# Patient Record
Sex: Female | Born: 1955 | Race: White | Hispanic: No | Marital: Married | Smoking: Never smoker
Health system: Southern US, Academic
[De-identification: ages and names within clinical notes are randomized; demographics above are authoritative.]

## PROBLEM LIST (undated history)

## (undated) DIAGNOSIS — H6690 Otitis media, unspecified, unspecified ear: Secondary | ICD-10-CM

## (undated) HISTORY — PX: HX BREAST BIOPSY: SHX20

## (undated) HISTORY — DX: Otitis media, unspecified, unspecified ear: H66.90

## (undated) HISTORY — PX: HX CYST REMOVAL: SHX22

---

## 2001-08-18 ENCOUNTER — Inpatient Hospital Stay (HOSPITAL_COMMUNITY): Admit: 2001-08-18 | Discharge: 2001-08-18 | Disposition: A | Discharge status: Home / Self Care | Payer: Self-pay

## 2010-05-11 ENCOUNTER — Inpatient Hospital Stay (HOSPITAL_COMMUNITY): Admit: 2010-05-11 | Discharge: 2010-05-11 | Disposition: A | Discharge status: Home / Self Care | Payer: Self-pay

## 2012-10-03 HISTORY — PX: HX BREAST BIOPSY: SHX20

## 2012-11-20 ENCOUNTER — Inpatient Hospital Stay (HOSPITAL_COMMUNITY): Admit: 2012-11-20 | Discharge: 2012-11-20 | Disposition: A | Discharge status: Home / Self Care | Payer: Self-pay

## 2016-03-24 ENCOUNTER — Ambulatory Visit (HOSPITAL_COMMUNITY): Payer: Self-pay | Admitting: Sports Medicine

## 2016-03-24 ENCOUNTER — Encounter (INDEPENDENT_AMBULATORY_CARE_PROVIDER_SITE_OTHER): Payer: Self-pay | Admitting: Sports Medicine

## 2016-03-24 ENCOUNTER — Ambulatory Visit (INDEPENDENT_AMBULATORY_CARE_PROVIDER_SITE_OTHER): Payer: No Typology Code available for payment source | Admitting: Sports Medicine

## 2016-03-24 VITALS — Ht 63.0 in | Wt 180.0 lb

## 2016-03-24 DIAGNOSIS — S92009A Unspecified fracture of unspecified calcaneus, initial encounter for closed fracture: Secondary | ICD-10-CM

## 2016-03-24 DIAGNOSIS — S93402A Sprain of unspecified ligament of left ankle, initial encounter: Secondary | ICD-10-CM

## 2016-03-24 DIAGNOSIS — S93409A Sprain of unspecified ligament of unspecified ankle, initial encounter: Secondary | ICD-10-CM

## 2016-03-24 DIAGNOSIS — S92002A Unspecified fracture of left calcaneus, initial encounter for closed fracture: Secondary | ICD-10-CM

## 2016-03-26 NOTE — Progress Notes (Signed)
[facility]  [address]  Monica Vargas Monica Vargas [id number]      NAME: Monica Vargas  DATE: 03/26/2016  DOB: 1956/05/17  MRN: [id number]      CHIEF COMPLAINT   Foot Injury (left calcaneal fx )    HPI:  Monica DandyMary is here for her left foot and heel.  She was at church and tripped over a speaker.  She had a twisting injury to this left foot. She has 4/10 pain.  It is lateral worse with putting weight on it.  It is better with Advil.  She has been using crutches. This injury happened about 3 or 4 days ago.  No paresthesias.  No skin issues.  No previous issues with this ankle.  No low back or radicular type signs. No calf pain or swelling.        PAST MEDICAL HISTORY:  There is no problem list on file for this patient.    PAST SURGICAL HISTORY:  Past Surgical History:   Procedure Laterality Date    HX CYST REMOVAL      breast          CURRENT MEDICATIONS:   No current outpatient prescriptions on file.     No current facility-administered medications for this visit.      ALLERGIES:  Review of patient's allergies indicates no known allergies.  SOCIAL HISTORY:   Social History     Social History    Marital status: Married     Spouse name: N/A    Number of children: N/A    Years of education: N/A     Social History Main Topics    Smoking status: Never Smoker    Smokeless tobacco: Never Used    Alcohol use Not on file    Drug use: Not on file    Sexual activity: Not on file     Other Topics Concern    Not on file     Social History Narrative    No narrative on file     FAMILY HISTORY:  No family history on file.        ROS  Left foot and heel injury - all others were reviewed and negative    PHYSICAL EXAM  Ht 1.6 m (5\' 3" )   Wt 81.6 kg (180 lb)   BMI 31.89 kg/m2        GENERAL EXAM    Constitutional: She is oriented to person, place, and time. She appears well-developed and well-nourished.   HENT:   Head: Normocephalic and atraumatic.   Eyes: Conjunctivae are normal. Pupils are equal, round.    Neck: Normal range of motion.   Cardiovascular: Normal rate.    Pulmonary/Chest: Effort normal.   Abdominal: Non-protuberant   Neurological: She is alert and oriented to person, place, and time.   Skin: Skin is warm and dry.   Psychiatric: She has a normal mood and affect. Her behavior is normal. Judgment and thought content normal.     Nursing note and vitals reviewed.    ORTHO EXAM   Physical examination left foot heel shows some mild swelling in the lateral aspect near the lateral ligamentous complex of the ankle.  He she has some mild pain with palpation in that area.  Calcaneal squeeze test is negative. Her Achilles is intact.  Her arch is normal her calf is soft and supple.  Her skin is normal. Her dorsiflexion plantar flexion inversion and inversion are within normal  limits.  Strength is mildly limited secondary to pain.  Normal motor sensory and vascular exam distally. I do not appreciate any ankle instability at 0 and plantar flexion with drawer talar tilt testing.    IMAGING     X-rays reviewed which shows no large fracture.  There is one view which shows a very small avulsion off of the what looks like the lateral wall of the calcaneus. This may be an avulsion of the calcaneal fibular ligament.        ASSESSMENT      (S93.409A) Sprain of ankle  (primary encounter diagnosis)  Plan: PHYSICAL THERAPY (ANKLE)            (S92.009A) Calcaneal fracture  Plan:           PLAN   At this point will treat it like a severe ankle sprain.  With aggressive rehab work herself off of the crutches. Ice and elevation.  She has used over-the-counter anti-inflammatories.  I will see her back in 6 weeks.       Duard LarsenJoseph Aunya Lemler, MD

## 2016-05-03 DIAGNOSIS — S92002A Unspecified fracture of left calcaneus, initial encounter for closed fracture: Secondary | ICD-10-CM | POA: Insufficient documentation

## 2016-05-10 ENCOUNTER — Ambulatory Visit
Admission: RE | Admit: 2016-05-10 | Discharge: 2016-05-10 | Disposition: A | Discharge status: Home / Self Care | Payer: BC Managed Care – PPO | Source: Ambulatory Visit | Attending: Sports Medicine | Admitting: Sports Medicine

## 2016-05-10 ENCOUNTER — Ambulatory Visit (INDEPENDENT_AMBULATORY_CARE_PROVIDER_SITE_OTHER): Payer: BC Managed Care – PPO | Admitting: Sports Medicine

## 2016-05-10 ENCOUNTER — Encounter (HOSPITAL_BASED_OUTPATIENT_CLINIC_OR_DEPARTMENT_OTHER): Payer: Self-pay | Admitting: Sports Medicine

## 2016-05-10 VITALS — Ht 63.0 in | Wt 180.0 lb

## 2016-05-10 DIAGNOSIS — S92002D Unspecified fracture of left calcaneus, subsequent encounter for fracture with routine healing: Secondary | ICD-10-CM

## 2016-05-10 DIAGNOSIS — S93402D Sprain of unspecified ligament of left ankle, subsequent encounter: Secondary | ICD-10-CM

## 2016-05-10 DIAGNOSIS — M7732 Calcaneal spur, left foot: Secondary | ICD-10-CM | POA: Insufficient documentation

## 2016-05-10 DIAGNOSIS — S92002A Unspecified fracture of left calcaneus, initial encounter for closed fracture: Principal | ICD-10-CM | POA: Insufficient documentation

## 2016-05-10 DIAGNOSIS — Z6831 Body mass index (BMI) 31.0-31.9, adult: Secondary | ICD-10-CM

## 2016-05-10 DIAGNOSIS — S93409A Sprain of unspecified ligament of unspecified ankle, initial encounter: Secondary | ICD-10-CM

## 2016-05-10 NOTE — Progress Notes (Signed)
[facility]  [address]  [place] [address]      NAME: IRINE HEMINGER  DATE: 05/10/2016  DOB: [birth year]  MRN: [id number]      CHIEF COMPLAINT   Foot Injury (left calcanel fx)    HPI:  Monica Vargas is here for follow-up of her left ankle sprain.  She is doing fantastic.  She is back to normal activities.  She has minimal to no pain. No new injury.  No neurovascular compromise.    PAST MEDICAL HISTORY:  Patient Active Problem List   Diagnosis    Left calcaneal fracture     PAST SURGICAL HISTORY:  Past Surgical History:   Procedure Laterality Date    HX CYST REMOVAL      breast          CURRENT MEDICATIONS:   No current outpatient prescriptions on file.     No current facility-administered medications for this visit.      ALLERGIES:  Review of patient's allergies indicates no known allergies.  SOCIAL HISTORY:   Social History     Social History    Marital status: Married     Spouse name: N/A    Number of children: N/A    Years of education: N/A     Social History Main Topics    Smoking status: Never Smoker    Smokeless tobacco: Never Used    Alcohol use Not on file    Drug use: Not on file    Sexual activity: Not on file     Other Topics Concern    Not on file     Social History Narrative     FAMILY HISTORY:  No family history on file.        ROS  Improved ankle pain left - all others were reviewed and negative    PHYSICAL EXAM  Ht 1.6 m ( )   Wt 81.6 kg (180 lb)   BMI 31.89 kg/m2        GENERAL EXAM    Constitutional: She is oriented to person, place, and time. She appears well-developed and well-nourished.   HENT:   Head: Normocephalic and atraumatic.   Eyes: Conjunctivae are normal. Pupils are equal, round.   Neck: Normal range of motion.   Cardiovascular: Normal rate.    Pulmonary/Chest: Effort normal.   Abdominal: Non-protuberant   Neurological: She is alert and oriented to person, place, and time.   Skin: Skin is warm and dry.   Psychiatric: She has a normal mood and  affect. Her behavior is normal. Judgment and thought content normal.     Nursing note and vitals reviewed.    ORTHO EXAM   No deformity.  No swelling or ecchymosis.  No erythema, fluctuance or drainage.   Skin is normal.  Dorsiflexion, plantarflexion, eversion, inversion are normal.  Strength is 5/5 in those directions.  Sensation is intact in all nerve distributions.  Pulses are palpable with brisk cap refill.  No ankle instability with drawer and talar tilt testing in neutral and plantarflexion.  Achilles tendon is intact. No pain with palpation.  Normal gait.    IMAGING             ASSESSMENT      (S92.002A) Left calcaneal fracture  (primary encounter diagnosis)  Plan: XR HEEL LEFT (CALCANEUS)            (S93.409A) Sprain of ankle  Plan:  PLAN   Activities as tolerated follow-up as needed       Duard LarsenJoseph Elwyn Lowden, MD

## 2019-04-22 ENCOUNTER — Other Ambulatory Visit (HOSPITAL_COMMUNITY): Payer: Self-pay | Admitting: FAMILY PRACTICE

## 2019-04-22 DIAGNOSIS — Z1231 Encounter for screening mammogram for malignant neoplasm of breast: Secondary | ICD-10-CM

## 2019-04-28 ENCOUNTER — Ambulatory Visit
Admission: RE | Admit: 2019-04-28 | Discharge: 2019-04-28 | Disposition: A | Discharge status: Home / Self Care | Payer: 59 | Source: Ambulatory Visit | Attending: FAMILY PRACTICE | Admitting: FAMILY PRACTICE

## 2019-04-28 ENCOUNTER — Other Ambulatory Visit: Payer: Self-pay

## 2019-04-28 ENCOUNTER — Encounter (HOSPITAL_COMMUNITY): Payer: Self-pay | Admitting: Mammography

## 2019-04-28 DIAGNOSIS — N6489 Other specified disorders of breast: Secondary | ICD-10-CM | POA: Insufficient documentation

## 2019-04-28 DIAGNOSIS — Z1231 Encounter for screening mammogram for malignant neoplasm of breast: Secondary | ICD-10-CM

## 2019-04-29 ENCOUNTER — Other Ambulatory Visit (HOSPITAL_COMMUNITY): Payer: Self-pay

## 2019-05-06 ENCOUNTER — Other Ambulatory Visit (HOSPITAL_COMMUNITY): Payer: Self-pay

## 2020-03-24 ENCOUNTER — Other Ambulatory Visit (HOSPITAL_COMMUNITY): Payer: Self-pay

## 2020-03-24 LAB — EXTERNAL COVID-19 MOLECULAR RESULT: External 2019-n-CoV/SARS-CoV-2: POSITIVE — AB

## 2020-03-25 ENCOUNTER — Emergency Department
Admission: EM | Admit: 2020-03-25 | Discharge: 2020-03-25 | Disposition: A | Discharge status: Other Acute Inpatient Hospital | Payer: 59

## 2020-03-25 DIAGNOSIS — Z5321 Procedure and treatment not carried out due to patient leaving prior to being seen by health care provider: Secondary | ICD-10-CM | POA: Insufficient documentation

## 2021-06-25 ENCOUNTER — Other Ambulatory Visit (HOSPITAL_COMMUNITY): Payer: Self-pay | Admitting: FAMILY PRACTICE

## 2021-06-25 DIAGNOSIS — Z1231 Encounter for screening mammogram for malignant neoplasm of breast: Secondary | ICD-10-CM

## 2021-07-08 ENCOUNTER — Ambulatory Visit (INDEPENDENT_AMBULATORY_CARE_PROVIDER_SITE_OTHER): Payer: 59 | Admitting: Mammography

## 2021-07-08 ENCOUNTER — Encounter (INDEPENDENT_AMBULATORY_CARE_PROVIDER_SITE_OTHER): Payer: Self-pay | Admitting: Mammography

## 2021-07-08 ENCOUNTER — Other Ambulatory Visit: Payer: Self-pay

## 2021-07-08 DIAGNOSIS — Z1231 Encounter for screening mammogram for malignant neoplasm of breast: Secondary | ICD-10-CM

## 2021-10-27 ENCOUNTER — Ambulatory Visit (HOSPITAL_BASED_OUTPATIENT_CLINIC_OR_DEPARTMENT_OTHER): Payer: Medicare Other | Admitting: Audiologist

## 2021-10-27 ENCOUNTER — Other Ambulatory Visit: Payer: Self-pay

## 2021-10-27 ENCOUNTER — Ambulatory Visit: Payer: Medicare Other | Attending: Otolaryngology | Admitting: Otolaryngology

## 2021-10-27 ENCOUNTER — Encounter (HOSPITAL_BASED_OUTPATIENT_CLINIC_OR_DEPARTMENT_OTHER): Payer: Self-pay | Admitting: Otolaryngology

## 2021-10-27 VITALS — Temp 98.2°F | Resp 16 | Ht 62.0 in | Wt 199.3 lb

## 2021-10-27 DIAGNOSIS — J343 Hypertrophy of nasal turbinates: Secondary | ICD-10-CM | POA: Insufficient documentation

## 2021-10-27 DIAGNOSIS — J309 Allergic rhinitis, unspecified: Secondary | ICD-10-CM | POA: Insufficient documentation

## 2021-10-27 DIAGNOSIS — H9313 Tinnitus, bilateral: Secondary | ICD-10-CM | POA: Insufficient documentation

## 2021-10-27 DIAGNOSIS — H905 Unspecified sensorineural hearing loss: Secondary | ICD-10-CM | POA: Insufficient documentation

## 2021-10-27 DIAGNOSIS — R0981 Nasal congestion: Secondary | ICD-10-CM | POA: Insufficient documentation

## 2021-10-27 DIAGNOSIS — H6983 Other specified disorders of Eustachian tube, bilateral: Secondary | ICD-10-CM | POA: Insufficient documentation

## 2021-10-27 DIAGNOSIS — H903 Sensorineural hearing loss, bilateral: Secondary | ICD-10-CM | POA: Insufficient documentation

## 2021-10-27 DIAGNOSIS — H73893 Other specified disorders of tympanic membrane, bilateral: Secondary | ICD-10-CM | POA: Insufficient documentation

## 2021-10-27 MED ORDER — AZELASTINE 137 MCG (0.1 %) NASAL SPRAY AEROSOL
1.00 | INHALATION_SPRAY | Freq: Two times a day (BID) | NASAL | 5 refills | Status: DC
Start: 2021-10-27 — End: 2021-11-23

## 2021-10-27 NOTE — H&P (Signed)
ENT, Physician Office Building  [address]  [place] [id number]  [phone]      Date: 10/27/2021  Name: Monica RoofMary K Vargas  Age: 66 y.o.  DOB:  1956/06/06    Chief Complaint: Otitis Media (Recurrent )      History of Present Illness:     Monica Vargas is a 66 y.o. female brought in today for evaluation of chronic ear issues as well as sinus problems.  This has been an ongoing problem over the past 5 years but seems to have got worse over the past year since COVID.    The patient will usually go to a facility for evaluation secondary to Congestion, Runny nose, Swollen glands and Lethargy. She has been treated for 2 ear infection(s) in the last 6 months. She has been treated for 2 ear infection(s) in the last 12 months. She has been treated with Z-pak. She confirms that there has been persistent fluid on the ear(s). She has a lot of allergy symptoms such as nasal congestion drainage. The patient has been treated long term with Flonase and Claritin. She feels that the treatment has not made a difference.     Corrie DandyMary has not had surgery performed on her ear(s). She states that there has not been trauma to her ear(s). There is a family history of hearing loss. She is concerned for a hearing deficit at this time. She does complain of tinnitus. She does not have a significant history of noise exposure. 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     In addition she discusses the fact that she has tinnitus which has been progressive over the past several years as well.  Affects both ears.    Past Medical History:     Past Medical History:   Diagnosis Date    Otitis media       Past Surgical History:   Procedure Laterality Date    Hx breast biopsy Right 2014    Hx breast biopsy Right     Hx breast biopsy Left     Hx cyst removal        Current Outpatient Medications   Medication Sig    azelastine (ASTELIN) 137 mcg (0.1 %) Nasal Aerosol, Spray Administer 1 Spray into each nostril Twice daily Use in  each nostril as directed    fluticasone propionate (FLONASE) 50 mcg/actuation Nasal Spray, Suspension Administer 1 Spray into each nostril Once a day    loratadine (CLARITIN) 10 mg Oral Tablet Take 1 Tablet (10 mg total) by mouth Once a day    multivit-minerals/folic acid (CENTRUM VITAMINTS ORAL) Take by mouth     No Known Allergies  Family Medical History:    None       Social History     Tobacco Use    Smoking status: Never    Smokeless tobacco: Never   Substance Use Topics    Alcohol use: Not on file      Review of Systems:     Constitutional: []  Fever; []  Chills []  Weight Loss;  []  Fatigue;  []  Other:  Eyes: []  Double Vision; []  Loss of Vision; []  Blurred Vision; []  Itchy/Watery Eyes; []  Other:  ENT: Negative for the remainder of the ENT review of systems except as documented in the HPI.  Cardiovascular: []  Chest Pain; []  Palpitations; []  Other:   Respiratory:  []  Wheezing; []  Shortness of Breath; []  Cough; []  Difficulty Breathing; []  Asthma; []  Other:  Gastrointestinal: []  Nausea; []  Vomiting; []  Indigestion; []  Reflux; []  Other:  Genitourinary: []  Painful Urination; []  Other:  Musculoskeletal: []  Muscle Aches; []  Joint Aches; []  Other:   Integumentary: []  Rash; []  Hives; []  Other:  Neurological: []  Headache; []  Numbness; []  Tingling; []  Weakness; []  Seizures; []  Other:  Endocrine: []  Hair Changes; []  Temp. Intolerances; []  Other:  Hematological: []  Easy Bruising; []  Anemia; []  Other:  Psychiatric: []  Anxiety; []  Depression; []  Other:     []  = negative  [x]  = positive    Physical Examination:     Temp 36.8 C (98.2 F) (Temporal)    Resp 16    Ht 1.575 m (5\' 2" )    Wt 90.4 kg (199 lb 4.7 oz)    BMI 36.45 kg/m     GENERAL: Patient is in no acute distress.  HEAD: Head is normocephalic, atraumatic. No palpable salivary gland masses.  FACE: Face is symmetric, cranial nerve 7 is intact bilaterally.  EYES: PERRL, EOMI. Sclera is white.  EARS: Binocular microscopy was used to evaluate the ears bilaterally.  External auditory canals are clear. Tympanic membranes are retracted bilaterally. She is able to move some air with force.  Indicative of eustachian tube dysfunction.    NOSE: Intranasally, no pus, polyps or epistaxis is appreciated. Severe nasal turbinate hypertrophy.   ORAL CAVITY: Healthy appearing lips, tongue and gums. There are no visible masses or lesions.  OROPHARYNX: Clear  NECK: Trachea is midline. No masses are palpated.  LYMPH: No lymphadenopathy palpable in the neck.  NEUROLOGICAL: Cranial nerves 2 through 12 are grossly intact.   SKIN: Skin is warm and dry to touch.  RESPIRATORY: No stridor.  MUSCULOSKELETAL: Extremities move equally well.  PSYCHIATRIC: Patient is pleasant, cooperative and alert.     Procedure:   I examined both ears under binocular microscopy and the physical exam findings are documented above.    Data Reviewed:   I personally reviewed the audiogram that was performed in office today.        Assessment and Plan:   Anitha was seen today for otitis media.    Diagnoses and all orders for this visit:    ETD (Eustachian tube dysfunction), bilateral  Tympanic membrane retraction, bilateral  -     ENT AUDBASE INTERFACE ORDER  -     92557 - COMPREHENSIVE AUDIOMETRY THRESHOLD EVALUATION AND SPEECH RECOGNITION (AMB ONLY)  -     92567 - TYMPANOMETRY (IMPEDANCE TESTING) (AMB ONLY)  At this time I have discussed eustachian tube dysfunction with the patient/family. We have discussed regular insufflation of the ears and I have shown them how to insufflate the ears. We have discussed potential adjunctive treatment with medications such as steroid or antihistamine nasal sprays and oral antihistamines.     SNHL (sensorineural hearing loss)  Tinnitus of both ears  I have discussed the nature of tinnitus secondary to high frequency nerve loss. I told the patient that this condition can get worse with time, so hearing protection is strongly recommended. The use of background noise can be an effective means  to control symptoms, especially at night. I also told the patient that the beta-flavonoid drug class has been shown in some studies to improve tinnitus related to high frequency SNHL and a drug such as Lipo-Flavonoid may be worth trying.  I also discussed the fact that hearing aids may be beneficial if the nerve loss gets worse.    Allergic rhinitis  Nasal congestion  Nasal turbinate hypertrophy  -  azelastine (ASTELIN) 137 mcg (0.1 %) Nasal Aerosol, Spray; Administer 1 Spray into each nostril Twice daily Use in each nostril as directed  During today's visit and evaluation we have discussed treatment for allergic rhinitis. I have informed the patient that there are several different ways to treat allergies including the use of medications, several of which have been prescribed or continued today. Often times these medications will need to be in different combinations and on a regular basis to control symptoms. In addition we have discussed the possibility of allergy testing and immunotherapy if positive. Allergy testing can be performed via skin testing or blood work (RAST). They were made aware that patients on certain medications (i.e. BETA BLOCKERS) and not candidates for immunotherapy. I have also discussed with the patient that often times multiple modalities are required to obtain optimal control of their allergies.      At this time we will proceed with the following: Continue Flonase and Claritin. Start Astelin and Budesonide and Mupirocin nasal compound rinse.  If no improvement consider CT scan at that point.     Plan for a return to clinic for evaluation in 8-12 weeks.     Shelah Lewandowsky, MD 10/27/2021, 11:06

## 2021-10-27 NOTE — Progress Notes (Signed)
See procedure report.

## 2021-11-21 ENCOUNTER — Other Ambulatory Visit (HOSPITAL_BASED_OUTPATIENT_CLINIC_OR_DEPARTMENT_OTHER): Payer: Self-pay | Admitting: Otolaryngology

## 2021-11-21 DIAGNOSIS — R0981 Nasal congestion: Secondary | ICD-10-CM

## 2021-11-21 DIAGNOSIS — J309 Allergic rhinitis, unspecified: Secondary | ICD-10-CM

## 2021-11-21 DIAGNOSIS — J343 Hypertrophy of nasal turbinates: Secondary | ICD-10-CM

## 2022-01-11 ENCOUNTER — Ambulatory Visit: Payer: Medicare Other | Attending: Otolaryngology | Admitting: Otolaryngology

## 2022-01-11 ENCOUNTER — Other Ambulatory Visit: Payer: Self-pay

## 2022-01-11 ENCOUNTER — Encounter (HOSPITAL_BASED_OUTPATIENT_CLINIC_OR_DEPARTMENT_OTHER): Payer: Self-pay | Admitting: Otolaryngology

## 2022-01-11 VITALS — Temp 98.9°F | Resp 18 | Ht 62.0 in | Wt 201.1 lb

## 2022-01-11 DIAGNOSIS — J309 Allergic rhinitis, unspecified: Secondary | ICD-10-CM | POA: Insufficient documentation

## 2022-01-11 DIAGNOSIS — J343 Hypertrophy of nasal turbinates: Secondary | ICD-10-CM | POA: Insufficient documentation

## 2022-01-11 NOTE — Progress Notes (Deleted)
j

## 2022-01-11 NOTE — Progress Notes (Signed)
ENT, Physician Office Building  [address]  [place] [address]  [phone]      Date: 01/11/2022  Name: Monica Vargas  Age: 66 y.o.  DOB:  [birth year]    Chief Complaint: ETD      History of Present Illness:     Monica Vargas is a 66 y.o. female following up on allergy care. Monica Vargas is not currently on immunotherapy. Patient is currently on adjunctive medications. These medications includes Astelin, Claritin and Flonase. Symptoms have included sinus and nasal congestion, sore throat, nasal blockage and post nasal drip. Monica Vargas voices overall improvement since beginning medical management of allergy symptoms.  She states she is quite happy with her results.  She did not receive the prescription for the compound sinus rinses that were ordered at the last visit.  She still continues to do well despite not being on that medication.    Past Medical History:     Past Medical History:   Diagnosis Date   . Otitis media       Past Surgical History:   Procedure Laterality Date   . Hx breast biopsy Right 2014   . Hx breast biopsy Right    . Hx breast biopsy Left    . Hx cyst removal        Current Outpatient Medications   Medication Sig   . azelastine (ASTELIN) 137 mcg (0.1 %) Nasal Aerosol, Spray ADMINISTER 1 SPRAY INTO EACH NOSTRIL TWICE DAILY USE IN EACH NOSTRIL AS DIRECTED   . fluticasone propionate (FLONASE) 50 mcg/actuation Nasal Spray, Suspension Administer 1 Spray into each nostril Once a day   . loratadine (CLARITIN) 10 mg Oral Tablet Take 1 Tablet (10 mg total) by mouth Once a day (Patient not taking: Reported on 01/11/2022)   . multivit-minerals/folic acid (CENTRUM VITAMINTS ORAL) Take by mouth   . UNKNOWN MEDICATION (UNKNOWN MEDICATION) Take 1 Tablet by mouth Once a day Prevagen     No Known Allergies     Family Medical History:    None       Social History     Tobacco Use   . Smoking status: Never   . Smokeless tobacco: Never   Substance Use Topics   . Alcohol use: Not on file      Review of Systems:      Constitutional: []  Fever; []  Chills []  Weight Loss;  []  Fatigue;  []  Other:  Eyes: []  Double Vision; []  Loss of Vision; []  Blurred Vision; []  Itchy/Watery Eyes; []  Other:  ENT: Negative for the remainder of the ENT review of systems except as documented in the HPI.  Cardiovascular: []  Chest Pain; []  Palpitations; []  Other:   Respiratory:  []  Wheezing; []  Shortness of Breath; []  Cough; []  Difficulty Breathing; []  Asthma; []  Other:  Gastrointestinal: []  Nausea; []  Vomiting; []  Indigestion; []  Reflux; []  Other:  Genitourinary: []  Painful Urination; []  Other:  Musculoskeletal: []  Muscle Aches; []  Joint Aches; []  Other:   Integumentary: []  Rash; []  Hives; []  Other:  Neurological: []  Headache; []  Numbness; []  Tingling; []  Weakness; []  Seizures; []  Other:  Endocrine: []  Hair Changes; []  Temp. Intolerances; []  Other:  Hematological: []  Easy Bruising; []  Anemia; []  Other:  Psychiatric: []  Anxiety; []  Depression; []  Other:     []  = negative  [x]  = positive    Physical Examination:     Temp 37.2 C (98.9 F) (Temporal)   Resp 18   Ht 1.575 m (5\' 2" )   Wt 91.2  kg (201 lb 1 oz)   BMI 36.77 kg/m     GENERAL: Patient is in no acute distress.  HEAD: Head is normocephalic, atraumatic. No palpable salivary gland masses.  FACE: Face is symmetric, cranial nerve 7 is intact bilaterally.  EYES: PERRL, EOMI. Sclera is white.  EARS: Binocular microscopy was used to evaluate the ears bilaterally. External auditory canals are clear. Tympanic membranes are translucent and healthy appearing bilaterally.   NOSE: Intranasally, no pus, polyps or epistaxis is appreciated. Nasal turbinate hypertrophy bilaterally.   ORAL CAVITY: Healthy appearing lips, tongue and gums. Maxillary tori noted  OROPHARYNX: Clear  NECK: Trachea is midline. No masses are palpated.  LYMPH: No lymphadenopathy palpable in the neck.  NEUROLOGICAL: Cranial nerves 2 through 12 are grossly intact.   SKIN: Skin is warm and dry to touch.  RESPIRATORY: No  stridor.  MUSCULOSKELETAL: Extremities move equally well.  PSYCHIATRIC: Patient is pleasant, cooperative and alert.     Procedure:   I examined both ears under binocular microscopy and the physical exam findings are documented above.    Data Reviewed:     None    Assessment and Plan:   Monica Vargas was seen today for etd.    Diagnoses and all orders for this visit:    Allergic rhinitis  Nasal turbinate hypertrophy    During today's visit and evaluation we have discussed treatment for allergic rhinitis. I have informed the patient that there are several different ways to treat allergies including the use of medications, several of which have been prescribed or continued today. Often times these medications will need to be in different combinations and on a regular basis to control symptoms. In addition we have discussed the possibility of allergy testing and immunotherapy if positive. Allergy testing can be performed via skin testing or blood work (RAST). They were made aware that patients on certain medications (i.e. BETA BLOCKERS) and not candidates for immunotherapy. I have also discussed with the patient that often times multiple modalities are required to obtain optimal control of their allergies.      At this time we will proceed with the following: Budesonide Mupirocin rinses were reordered today. Continue medical manage as directed.    Return in about 1 year (around 01/12/2023).     Monica Lewandowsky, MD  01/11/2022, 15:02

## 2023-01-17 ENCOUNTER — Ambulatory Visit (HOSPITAL_BASED_OUTPATIENT_CLINIC_OR_DEPARTMENT_OTHER): Payer: Self-pay | Admitting: Otolaryngology

## 2023-07-06 ENCOUNTER — Other Ambulatory Visit (HOSPITAL_COMMUNITY): Payer: Self-pay | Admitting: FAMILY PRACTICE

## 2023-07-06 DIAGNOSIS — Z1231 Encounter for screening mammogram for malignant neoplasm of breast: Secondary | ICD-10-CM

## 2023-07-10 ENCOUNTER — Other Ambulatory Visit: Payer: Self-pay

## 2023-07-10 ENCOUNTER — Ambulatory Visit (INDEPENDENT_AMBULATORY_CARE_PROVIDER_SITE_OTHER): Payer: Medicare Other | Admitting: Mammography

## 2023-07-10 ENCOUNTER — Encounter (INDEPENDENT_AMBULATORY_CARE_PROVIDER_SITE_OTHER): Payer: Self-pay | Admitting: Mammography

## 2023-07-10 DIAGNOSIS — Z1231 Encounter for screening mammogram for malignant neoplasm of breast: Secondary | ICD-10-CM

## 2024-07-24 ENCOUNTER — Other Ambulatory Visit (HOSPITAL_COMMUNITY): Payer: Self-pay | Admitting: FAMILY PRACTICE

## 2024-07-24 ENCOUNTER — Encounter (HOSPITAL_COMMUNITY): Payer: Self-pay | Admitting: FAMILY PRACTICE

## 2024-07-24 DIAGNOSIS — E2839 Other primary ovarian failure: Secondary | ICD-10-CM

## 2024-07-26 ENCOUNTER — Other Ambulatory Visit (HOSPITAL_COMMUNITY): Payer: Self-pay | Admitting: FAMILY PRACTICE

## 2024-07-26 DIAGNOSIS — Z1231 Encounter for screening mammogram for malignant neoplasm of breast: Secondary | ICD-10-CM

## 2024-08-16 ENCOUNTER — Other Ambulatory Visit (HOSPITAL_COMMUNITY): Payer: Self-pay | Admitting: FAMILY PRACTICE

## 2024-08-16 DIAGNOSIS — R748 Abnormal levels of other serum enzymes: Secondary | ICD-10-CM

## 2024-08-23 ENCOUNTER — Other Ambulatory Visit: Payer: Self-pay

## 2024-08-23 ENCOUNTER — Ambulatory Visit
Admission: RE | Admit: 2024-08-23 | Discharge: 2024-08-23 | Disposition: A | Payer: Self-pay | Source: Ambulatory Visit | Attending: FAMILY PRACTICE

## 2024-08-23 DIAGNOSIS — E2839 Other primary ovarian failure: Secondary | ICD-10-CM | POA: Insufficient documentation

## 2024-09-02 ENCOUNTER — Other Ambulatory Visit: Payer: Self-pay

## 2024-09-02 ENCOUNTER — Other Ambulatory Visit (HOSPITAL_COMMUNITY): Payer: Self-pay | Admitting: FAMILY PRACTICE

## 2024-09-02 ENCOUNTER — Ambulatory Visit (INDEPENDENT_AMBULATORY_CARE_PROVIDER_SITE_OTHER): Payer: Self-pay | Admitting: Mammography

## 2024-09-02 ENCOUNTER — Encounter (INDEPENDENT_AMBULATORY_CARE_PROVIDER_SITE_OTHER): Payer: Self-pay | Admitting: Mammography

## 2024-09-02 DIAGNOSIS — R928 Other abnormal and inconclusive findings on diagnostic imaging of breast: Secondary | ICD-10-CM

## 2024-09-02 DIAGNOSIS — Z1231 Encounter for screening mammogram for malignant neoplasm of breast: Secondary | ICD-10-CM

## 2024-09-09 ENCOUNTER — Other Ambulatory Visit (INDEPENDENT_AMBULATORY_CARE_PROVIDER_SITE_OTHER): Payer: Self-pay

## 2024-09-09 ENCOUNTER — Other Ambulatory Visit: Payer: Self-pay

## 2024-09-09 DIAGNOSIS — K7689 Other specified diseases of liver: Secondary | ICD-10-CM

## 2024-09-09 DIAGNOSIS — K824 Cholesterolosis of gallbladder: Secondary | ICD-10-CM

## 2024-09-09 DIAGNOSIS — R748 Abnormal levels of other serum enzymes: Secondary | ICD-10-CM

## 2024-09-09 DIAGNOSIS — K760 Fatty (change of) liver, not elsewhere classified: Secondary | ICD-10-CM

## 2024-09-13 ENCOUNTER — Ambulatory Visit: Admission: RE | Admit: 2024-09-13 | Discharge: 2024-09-13 | Attending: FAMILY PRACTICE

## 2024-09-13 ENCOUNTER — Other Ambulatory Visit: Payer: Self-pay

## 2024-09-13 DIAGNOSIS — R928 Other abnormal and inconclusive findings on diagnostic imaging of breast: Secondary | ICD-10-CM | POA: Insufficient documentation
